# Patient Record
Sex: Male | Born: 1955 | Race: White | Hispanic: No | Marital: Married | Smoking: Never smoker
Health system: Southern US, Community
[De-identification: ages and names within clinical notes are randomized; demographics above are authoritative.]

## PROBLEM LIST (undated history)

## (undated) DIAGNOSIS — I10 Essential (primary) hypertension: Secondary | ICD-10-CM

## (undated) DIAGNOSIS — N289 Disorder of kidney and ureter, unspecified: Secondary | ICD-10-CM

## (undated) DIAGNOSIS — E119 Type 2 diabetes mellitus without complications: Secondary | ICD-10-CM

## (undated) HISTORY — PX: TONSILLECTOMY: SUR1361

## (undated) HISTORY — PX: KNEE SURGERY: SHX244

## (undated) HISTORY — PX: APPENDECTOMY: SHX54

---

## 2005-11-17 ENCOUNTER — Inpatient Hospital Stay (HOSPITAL_COMMUNITY): Admission: EM | Admit: 2005-11-17 | Discharge: 2005-11-18 | Payer: Self-pay | Admitting: Emergency Medicine

## 2006-02-01 ENCOUNTER — Other Ambulatory Visit: Payer: Self-pay

## 2006-02-07 ENCOUNTER — Ambulatory Visit: Payer: Self-pay | Admitting: Orthopedic Surgery

## 2014-03-29 ENCOUNTER — Encounter (HOSPITAL_COMMUNITY): Payer: Self-pay | Admitting: Emergency Medicine

## 2014-03-29 ENCOUNTER — Emergency Department (HOSPITAL_COMMUNITY)
Admission: EM | Admit: 2014-03-29 | Discharge: 2014-03-29 | Disposition: A | Discharge status: Home / Self Care | Payer: BC Managed Care – PPO | Attending: Emergency Medicine | Admitting: Emergency Medicine

## 2014-03-29 ENCOUNTER — Emergency Department (HOSPITAL_COMMUNITY): Payer: BC Managed Care – PPO

## 2014-03-29 DIAGNOSIS — E119 Type 2 diabetes mellitus without complications: Secondary | ICD-10-CM | POA: Diagnosis not present

## 2014-03-29 DIAGNOSIS — M47814 Spondylosis without myelopathy or radiculopathy, thoracic region: Secondary | ICD-10-CM

## 2014-03-29 DIAGNOSIS — Z79899 Other long term (current) drug therapy: Secondary | ICD-10-CM | POA: Diagnosis not present

## 2014-03-29 DIAGNOSIS — I10 Essential (primary) hypertension: Secondary | ICD-10-CM | POA: Insufficient documentation

## 2014-03-29 DIAGNOSIS — M546 Pain in thoracic spine: Secondary | ICD-10-CM

## 2014-03-29 DIAGNOSIS — Z87448 Personal history of other diseases of urinary system: Secondary | ICD-10-CM | POA: Diagnosis not present

## 2014-03-29 DIAGNOSIS — R52 Pain, unspecified: Secondary | ICD-10-CM

## 2014-03-29 DIAGNOSIS — M5134 Other intervertebral disc degeneration, thoracic region: Secondary | ICD-10-CM | POA: Diagnosis not present

## 2014-03-29 DIAGNOSIS — M545 Low back pain: Secondary | ICD-10-CM | POA: Diagnosis present

## 2014-03-29 DIAGNOSIS — Z7982 Long term (current) use of aspirin: Secondary | ICD-10-CM | POA: Insufficient documentation

## 2014-03-29 DIAGNOSIS — Z791 Long term (current) use of non-steroidal anti-inflammatories (NSAID): Secondary | ICD-10-CM | POA: Insufficient documentation

## 2014-03-29 HISTORY — DX: Disorder of kidney and ureter, unspecified: N28.9

## 2014-03-29 HISTORY — DX: Type 2 diabetes mellitus without complications: E11.9

## 2014-03-29 HISTORY — DX: Essential (primary) hypertension: I10

## 2014-03-29 MED ORDER — ONDANSETRON HCL 8 MG PO TABS: 8.0000 mg | ORAL_TABLET | Freq: Three times a day (TID) | ORAL | Status: DC | PRN

## 2014-03-29 MED ORDER — DIAZEPAM 10 MG PO TABS: 10.0000 mg | ORAL_TABLET | Freq: Four times a day (QID) | ORAL | Status: AC | PRN

## 2014-03-29 NOTE — Discharge Instructions (Signed)
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 Treatments include rest, heat and anti-inflammatory medications. We are prescribing some muscle relaxers to use to help you relax and improve your discomfort. Do not work or drive when taking the muscle relaxers. Use the resource guide provided, to find a primary care doctor to arrange for further care and treatment.  Back Pain, Adult Back pain is very common. The pain often gets better over time. The cause of back pain is usually not dangerous. Most people can learn to manage their back pain on their own.  HOME CARE   Stay active. Start with short walks on flat ground if you can. Try to walk farther each day.  Do not sit, drive, or stand in one place for more than 30 minutes. Do not stay in bed.  Do not avoid exercise or work. Activity can help your back heal faster.  Be careful when you bend or lift an object. Bend at your knees, keep the object close to you, and do not twist.  Sleep on a firm mattress. Lie on your side, and bend your knees. If you lie on your back, put a pillow under your knees.  Only take medicines as told by your doctor.  Put ice on the injured area.  Put ice in a plastic bag.  Place a towel between your skin and the bag.  Leave the ice on for 15-20 minutes, 03-04 times a day for the first 2 to 3 days. After that, you can switch between ice and heat packs.  Ask your doctor about back exercises or massage.  Avoid feeling anxious or stressed. Find good ways to deal with stress, such as exercise. GET HELP RIGHT AWAY IF:   Your pain does not go away with rest or medicine.  Your pain does not go away in 1 week.  You have new problems.  You do not feel well.  The pain spreads into your legs.  You cannot control when you poop (bowel movement) or pee (urinate).  Your arms or legs feel weak or lose feeling (numbness).  You feel sick to your stomach (nauseous) or throw up  (vomit).  You have belly (abdominal) pain.  You feel like you may pass out (faint). MAKE SURE YOU:   Understand these instructions.  Will watch your condition.  Will get help right away if you are not doing well or get worse. Document Released: 08/08/2007 Document Revised: 05/14/2011 Document Reviewed: 06/23/2013 [facility] Patient Information 2015 [place], [place]. This information is not intended to replace advice given to you by your health care provider. Make sure you discuss any questions you have with your health care provider.  Degenerative Disk Disease Degenerative disk disease is a condition caused by the changes that occur in the cushions of the backbone (spinal disks) as you grow older. Spinal disks are soft and compressible disks located between the bones of the spine (vertebrae). They act like shock absorbers. Degenerative disk disease can affect the whole spine. However, the neck and lower back are most commonly affected. Many changes can occur in the spinal disks with aging, such as:  The spinal disks may dry and shrink.  Small tears may occur in the tough, outer covering of the disk (annulus).  The disk space may become smaller due to loss of water.  Abnormal growths in the bone (spurs) may occur. This can put pressure on the nerve roots exiting the spinal canal, causing pain.  The  spinal canal may become narrowed. CAUSES  Degenerative disk disease is a condition caused by the changes that occur in the spinal disks with aging. The exact cause is not known, but there is a genetic basis for many patients. Degenerative changes can occur due to loss of fluid in the disk. This makes the disk thinner and reduces the space between the backbones. Small cracks can develop in the outer layer of the disk. This can lead to the breakdown of the disk. You are more likely to get degenerative disk disease if you are overweight. Smoking cigarettes and doing heavy work such as weightlifting  can also increase your risk of this condition. Degenerative changes can start after a sudden injury. Growth of bone spurs can compress the nerve roots and cause pain.  SYMPTOMS  The symptoms vary from person to person. Some people may have no pain, while others have severe pain. The pain may be so severe that it can limit your activities. The location of the pain depends on the part of your backbone that is affected. You will have neck or arm pain if a disk in the neck area is affected. You will have pain in your back, buttocks, or legs if a disk in the lower back is affected. The pain becomes worse while bending, reaching up, or with twisting movements. The pain may start gradually and then get worse as time passes. It may also start after a major or minor injury. You may feel numbness or tingling in the arms or legs.  DIAGNOSIS  Your caregiver will ask you about your symptoms and about activities or habits that may cause the pain. He or she may also ask about any injuries, diseases, or treatments you have had earlier. Your caregiver will examine you to check for the range of movement that is possible in the affected area, to check for strength in your extremities, and to check for sensation in the areas of the arms and legs supplied by different nerve roots. An X-ray of the spine may be taken. Your caregiver may suggest other imaging tests, such as magnetic resonance imaging (MRI), if needed.  TREATMENT  Treatment includes rest, modifying your activities, and applying ice and heat. Your caregiver may prescribe medicines to reduce your pain and may ask you to do some exercises to strengthen your back. In some cases, you may need surgery. You and your caregiver will decide on the treatment that is best for you. HOME CARE INSTRUCTIONS   Follow proper lifting and walking techniques as advised by your caregiver.  Maintain good posture.  Exercise regularly as advised.  Perform relaxation  exercises.  Change your sitting, standing, and sleeping habits as advised. Change positions frequently.  Lose weight as advised.  Stop smoking if you smoke.  Wear supportive footwear. SEEK MEDICAL CARE IF:  Your pain does not go away within 1 to 4 weeks. SEEK IMMEDIATE MEDICAL CARE IF:   Your pain is severe.  You notice weakness in your arms, hands, or legs.  You begin to lose control of your bladder or bowel movements. MAKE SURE YOU:   Understand these instructions.  Will watch your condition.  Will get help right away if you are not doing well or get worse. Document Released: 12/17/2006 Document Revised: 05/14/2011 Document Reviewed: 06/23/2013 [facility] Patient Information 2015 [place], [place]. This information is not intended to replace advice given to you by your health care provider. Make sure you discuss any questions you have with your health  care provider.  Emergency Department Resource Guide 1) Find a Doctor and Pay Out of Pocket Although you won't have to find out who is covered by your insurance plan, it is a good idea to ask around and get recommendations. You will then need to call the office and see if the doctor you have chosen will accept you as a new patient and what types of options they offer for patients who are self-pay. Some doctors offer discounts or will set up payment plans for their patients who do not have insurance, but you will need to ask so you aren't surprised when you get to your appointment.  2) Contact Your Local Health Department Not all health departments have doctors that can see patients for sick visits, but many do, so it is worth a call to see if yours does. If you don't know where your local health department is, you can check in your phone book. The CDC also has a tool to help you locate your state's health department, and many state websites also have listings of all of their local health departments.  3) Find a Walk-in Clinic If your  illness is not likely to be very severe or complicated, you may want to try a walk in clinic. These are popping up all over the country in pharmacies, drugstores, and shopping centers. They're usually staffed by nurse practitioners or physician assistants that have been trained to treat common illnesses and complaints. They're usually fairly quick and inexpensive. However, if you have serious medical issues or chronic medical problems, these are probably not your best option.  No Primary Care Doctor: - Call Health Connect at  [phone] - they can help you locate a primary care doctor that  accepts your insurance, provides certain services, etc. - Physician Referral Service- [phone]  Chronic Pain Problems: Organization         Address  Phone   Notes  [facility]  [phone] Patients need to be referred by their primary care doctor.   Medication Assistance: Organization         Address  Phone   Notes  [facility] Medication [facility] [address] [place] [place], [place] [address] [phone] --Must be a resident of [facility] -- Must have NO insurance coverage whatsoever (no Medicaid/ Medicare, etc.) -- The pt. MUST have a primary care doctor that directs their care regularly and follows them in the community   MedAssist  [phone]   [employer]  [phone]    Agencies that provide inexpensive medical care: Organization         Address  Phone   Notes  [facility]  [phone]   [facility]    [phone]   [facility] [address] [place], [place] [address] [phone]   [facility] [address] [place], [place] [phone]   Planned Parenthood    [phone]   [facility]    [phone]   Community Health and [facility]  [address], [place] Phone:  [phone], Fax:  [phone] Hours of Operation:   9 am - 6 pm, M-F.  Also accepts Medicaid/Medicare and self-pay.  [facility]  [address], [place] Phone: [phone], Fax: [phone]. Hours of Operation:  8:30 am - 5:30 pm, M-F.  Also accepts Medicaid and self-pay.  [facility] [place] [address], [place] Phone: [phone](336) [phone]   [facility] [address], [place], [place] [phone], Ext. [phone] Mondays & Thursdays: 7-9 AM.  First 15 patients are seen on a first come, first serve basis.    Medicaid-accepting [facility] Providers:  Organization         Address  Phone   Notes  [facility] [address], [place] [phone]) [phone] Also accepts self-pay patients.  [facility] [address] [place], [place]  [phone](336) [phone]   [facility] [address], [place] [phone]) [phone]   [facility] [address] [place] [address], [place] [phone]) [phone]   Renaye RakersVeita Bland [address], [place]   [phone]) [phone] Only accepts [place] Access [place] patients after they have their name applied to their card.   Self-Pay (no insurance) in [facility]:  Organization         Address  Phone   Notes  Sickle Cell Patients, [facility] [address], [place] [phone]) [phone]   [facility] [address], [place] [phone]) [phone]   [facility] [place]  [address], [place] [phone]) [phone]   [facility]/Dr. Osei-Bonsu  [address] [place] [address], [place] or [phone], [place] [phone] Phone number for both [place] and [place] locations is the same.  [facility] [address], [place] [phone]) [phone]   [facility] [address] [place] [address], [place] or [address] [phone](336) [phone]   [facility] [address], [place] [phone]) [phone], phone; [phone](336) [phone], fax Sees patients 1st and 3rd Saturday of every month.  Must not qualify for public or private insurance (i.e. Medicaid, Medicare, [place] Health Choice, Veterans' Benefits)  Household income should be no more than 200% of the poverty level The clinic cannot treat you if you are pregnant or think you are pregnant  Sexually transmitted diseases are not treated at the clinic.    Dental Care: Organization         Address  Phone  Notes  [facility] of [facility] [address], [place] [phone] Accepts children up to age 61 who are enrolled in [place] or [place] Health Choice; pregnant women with a Medicaid card; and children who have applied for Medicaid or [place] Health Choice, but were declined, whose parents can pay a reduced fee at time of service.  [facility] Department of [facility] [place]  [address], [place] [phone]) [phone] Accepts children up to age 61 who are enrolled in [place] or [place] Health Choice; pregnant women with a Medicaid card; and children who have applied for Medicaid or [place] Health Choice, but were declined, whose parents can pay a reduced fee at time of service.  [facility]  [address], [place] [phone]) [phone] Patients are seen by appointment only. Walk-ins are not accepted. [facility] will see patients 61 years of age and older. Monday - Tuesday (8am-5pm) Most Wednesdays (8:30-5pm) $30 per visit, cash only  [facility]  [address], [facility] [phone] Patients are seen by appointment only. Walk-ins are not accepted. [facility] will see patients 61 years of age  and older. One Wednesday Evening (Monthly: Volunteer Based).  $30 per visit, cash only  [employer]  [phone] for adults; Children under age 32, call Graduate Pediatric Dentistry at  [phone]. Children aged 56-14, please call [phone] to request a pediatric application.  Dental services are provided in all areas of dental care including fillings, crowns and bridges, complete and partial dentures, implants, gum treatment, root canals, and extractions. Preventive care is also provided. Treatment is provided to both adults and children. Patients are selected via a lottery and there is often a waiting list.   [facility] [address] [place]  [phone] [email or web address]   [facility] [address] [place], [place] [phone], Ext. [phone] Second and Fourth Thursday of each month, opens at 6:30 AM; Clinic ends at 9 AM.  Patients are seen on a first-come first-served basis, and a limited number are seen during each clinic.   [facility]  [address] [place], [place] [phone]   Eligibility Requirements You must have lived in [place], [place], or [place] counties for at least the last three months.   You cannot be eligible for state or federal sponsored [place], including [employer], [place], or [employer].   You generally cannot be eligible for healthcare insurance through your employer.    How to apply: Eligibility screenings are held every Tuesday and Wednesday afternoon from 1:00 pm until 4:00 pm. You do not need an appointment for the interview!  [facility] [address], [place], [place] [phone]   [facility] Health Department  [phone]   [facility] Health Department  [phone]   [facility] Health Department  [phone]    Behavioral Health Resources in the Community: Intensive Outpatient Programs Organization         Address  Phone  Notes  [facility] [address] [place], [place] [phone]   [facility] Outpatient [address] [place], [place] [phone]   [facility]: Alcohol &  Drug [facility]cs [address] [place], [place]  [phone]   [facility] Mental Health [address]  [place], [place] [phone] or [phone]   Substance Abuse Resources Organization         Address  Phone  Notes  [facility] Drug Services  [phone]   [facility]  [phone]   [facility] [place]  [phone]   Floydene Flock  [phone]   Residential & Outpatient Substance Abuse Program  [phone]   Psychological Services Organization         Address  Phone  Notes  [facility] Behavioral Health  [phone]   [facility]  [phone]   [facility] Mental Health [address] [place] [phone] or [phone]    Mobile Crisis Teams Organization         Address  Phone  Notes  [facility], Mobile Crisis Care Unit  [phone]   [facility]  [address] [place], [place] [phone]   Doristine Locks [address] [place] [phone]    Self-Help/Support Groups Organization         Address  Phone             Notes  [facility] of [place] - variety of support groups  336- I7437963 Call for more information  Narcotics Anonymous (NA), [facility] [address], [employer] [place]  2 meetings  at this location   Residential Treatment Programs Organization         Address  Phone  Notes  [facility] [address]    [place]  [phone]   [facility]  [address], [place] [address], [place], [place] [phone]   [facility] [address] [place], [place] [phone] Admissions: 8am-3pm M-F  [facility] [address]    [place], [place] [phone]   [facility] [address] [place], [place], [place] [phone]   [facility] [address]  [place], [place] [phone]   [facility] - Intensive Outpatient [address], [place], [place]  [phone]   [facility] ([facility] [address]  [place], [place] [phone] or [phone]   [facility] ([facility]) [address] [place], [place] [phone] Accepts Medicaid  [facility] [place] [address],  [place] [phone] Substance Abuse/Addiction Treatment   [facility] Organization         Address  Phone  Notes  [facility]  [phone]   Angie Fava, PhD [address] [place], [place]   [phone] or [phone]   [facility] Behavioral   [address] [place], [place] [phone]   [facility] [address], [place], [place] [phone] Insurance/Medicaid/sponsorship through [facility] and Families [address]                                    [place], [place] [phone] Therapy/tele-psych/case  [facility] [address], [place] [phone]    Dr. Lolly Mustache  [phone]   [facility]  [employer] [facility] Dept. 1) [address] [place] 2) [address], [place] 3)  [address], [place] [phone]  [phone]   [facility] Child Abuse Hotline (726)683-4296 or [phone] (After Hours)

## 2014-03-29 NOTE — ED Notes (Signed)
PT c/o lower back pain x4 weeks and was started on Meloxicam by his primary doctor on 03/23/14. PT c/o worsening in lower back pain at times in the past few days. PT denies any urinary symptoms or injury to his back.

## 2014-03-29 NOTE — ED Notes (Signed)
MD at bedside. 

## 2014-03-29 NOTE — ED Provider Notes (Signed)
CSN: [id number]     Arrival date & time 03/29/14  [id number] History  This chart was scribed for Flint MelterElliott L Raihana Balderrama, MD by Tonye RoyaltyJoshua Chen, ED Scribe. This patient was seen in room APA11/[facility]11 and the patient's care was started at 9:32 AM.    Chief Complaint  Patient presents with  . Back Pain   The history is provided by the patient. No language interpreter was used.    HPI Comments: Douglas Hoover is a 59 y.o. male with history of HTN and DM who presents to the Emergency Department complaining of low back pain and right lower thoracic pain with onset 4 weeks ago. He states he has not had similar pain previously. He was started on Meloxicam on 1/19 but states he has not had any improvement. He states pain does improve after using heating pad. He denies any injury. He reports increased pain with walking or standing for extended periods of time. He states pain goes down to his thighs and extends further after standing for an extended time. 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 He also notes a different back pain with onset 2-3 months ago after moving some wood. He reports prior surgeries to his appendix, knee, and finger. He denies ever smoking. He denies SOB or coldness in his feet  Past Medical History  Diagnosis Date  . Hypertension   . Diabetes mellitus without complication   . Renal disorder    Past Surgical History  Procedure Laterality Date  . Appendectomy    . Knee surgery    . Tonsillectomy     Family History  Problem Relation Age of Onset  . Heart failure Mother   . Hypertension Mother    History  Substance Use Topics  . Smoking status: Never Smoker   . Smokeless tobacco: Not on file  . Alcohol Use: No    Review of Systems  Respiratory: Negative for shortness of breath.   Musculoskeletal: Positive for back pain.  All other systems reviewed and are negative.     Allergies  Peppermint oil  Home Medications    Prior to Admission medications   Medication Sig Start Date End Date Taking? Authorizing Provider  acetaminophen (TYLENOL) 650 MG CR tablet Take 1,300 mg by mouth every 8 (eight) hours as needed for pain.   Yes Historical Provider, MD  aspirin EC 81 MG tablet Take 81 mg by mouth daily.   Yes Historical Provider, MD  diazepam (VALIUM) 10 MG tablet Take 1 tablet (10 mg total) by mouth every 6 (six) hours as needed (Muscle spasm). 03/29/14   Flint MelterElliott L Donnalynn Wheeless, MD  lisinopril (PRINIVIL,ZESTRIL) 20 MG tablet Take 20 mg by mouth daily.   Yes Historical Provider, MD  meloxicam (MOBIC) 7.5 MG tablet Take 7.5 mg by mouth daily.   Yes Historical Provider, MD  metFORMIN (GLUCOPHAGE) 1000 MG tablet Take 1,000 mg by mouth 2 (two) times daily with a meal.   Yes Historical Provider, MD   BP 157/89 mmHg  Pulse 89  Temp(Src) 97.9 F (36.6 C) (Oral)  Resp 16  Ht 5\' 7"  (1.702 m)  Wt 242 lb (109.77 kg)  BMI 37.89 kg/m2  SpO2 98% Physical Exam  Constitutional: He is oriented to person, place, and time. He appears well-developed and well-nourished.  HENT:  Head: Normocephalic and atraumatic.  Right Ear: External ear normal.  Left Ear: External ear normal.  Eyes: Conjunctivae and EOM are normal.  Pupils are equal, round, and reactive to light.  Neck: Normal range of motion and phonation normal. Neck supple.  Cardiovascular: Normal rate, regular rhythm and normal heart sounds.   Pulmonary/Chest: Effort normal and breath sounds normal. He exhibits tenderness (Left chest wall tend without deformity or crepitation). He exhibits no bony tenderness.  Abdominal: Soft. There is no tenderness.  Musculoskeletal: Normal range of motion.  On deep breath has right lower thoracic pain Right paravertebral lumbar tenderness  Neurological: He is alert and oriented to person, place, and time. No cranial nerve deficit or sensory deficit. He exhibits normal muscle tone. Coordination normal.  Skin: Skin is warm, dry and intact.   Psychiatric: He has a normal mood and affect. His behavior is normal. Judgment and thought content normal.  Nursing note and vitals reviewed.   ED Course  Procedures (including critical care time)  DIAGNOSTIC STUDIES: Oxygen Saturation is 96% on room air, adequate by my interpretation.    COORDINATION OF CARE: 9:40 AM Discussed treatment plan with patient at beside, the patient agrees with the plan and has no further questions at this time.  10:29 AM Discussed with patient his imaging results which reveal evidence of arthritis. Patient agrees with plan for discharge with prescription for muscle relaxants.  Labs Review Labs Reviewed - No data to display  Imaging Review Dg Ribs Unilateral W/chest Right  03/29/2014   CLINICAL DATA:  Pain  EXAM: RIGHT RIBS AND CHEST - 3+ VIEW  COMPARISON:  None.  FINDINGS: No fracture or other bone lesions are seen involving the ribs. There is no evidence of pneumothorax or pleural effusion. Both lungs are clear. Heart size and mediastinal contours are within normal limits.  IMPRESSION: Negative.   Electronically Signed   By: Elige Ko   On: 03/29/2014 10:16   Dg Lumbar Spine Complete  03/29/2014   CLINICAL DATA:  Low back pain for 1 month, no known injury, initial encounter  EXAM: LUMBAR SPINE - COMPLETE 4+ VIEW  COMPARISON:  None.  FINDINGS: Five lumbar type vertebral bodies are well visualized. Vertebral body height is well maintained. No spondylolysis or spondylolisthesis is noted. Mild facet hypertrophic changes are noted.  IMPRESSION: Degenerative change without acute abnormality.   Electronically Signed   By: Alcide Clever M.D.   On: 03/29/2014 10:22     EKG Interpretation None      MDM   Final diagnoses:  Pain  Right-sided thoracic back pain  Degenerative joint disease of thoracic spine      Back pain related to degenerative joint disease of the thoracic spine.  There is likely mild radicular component.  Doubt spinal stenosis or spinal  myelopathy.  Nursing Notes Reviewed/ Care Coordinated Applicable Imaging Reviewed Interpretation of Laboratory Data incorporated into ED treatment  The patient appears reasonably screened and/or stabilized for discharge and I doubt any other medical condition or other [facility] requiring further screening, evaluation, or treatment in the ED at this time prior to discharge.  Plan: Home Medications- Valium, continue Meloxicam; Home Treatments- heat; return here if the recommended treatment, does not improve the symptoms; Recommended follow up- PCP 1 week   I personally performed the services described in this documentation, which was scribed in my presence. The recorded information has been reviewed and is accurate.     Flint Melter, MD 03/29/14 [phone]

## 2016-02-04 IMAGING — DX DG LUMBAR SPINE COMPLETE 4+V
5 series · 5 of 5 positions shown · non-contrast
Comparison: None.

CLINICAL DATA: Low back pain for 1 month, no known injury, initial
encounter

EXAM:
LUMBAR SPINE - COMPLETE 4+ VIEW

[l-spine ap]
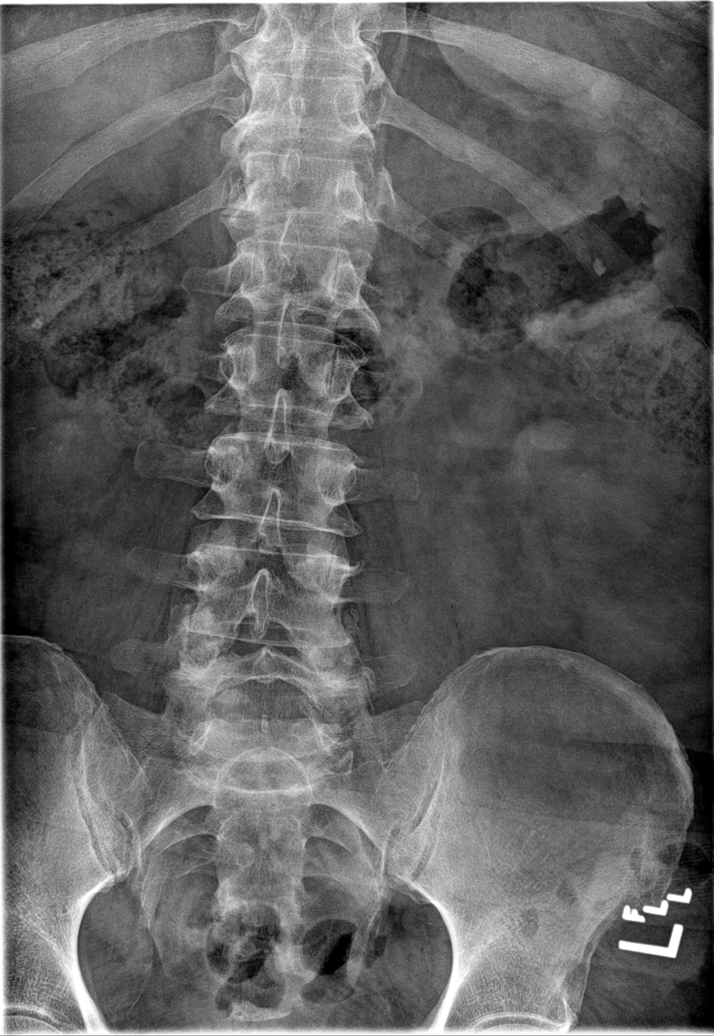

[l-spine obl (1 of 2)]
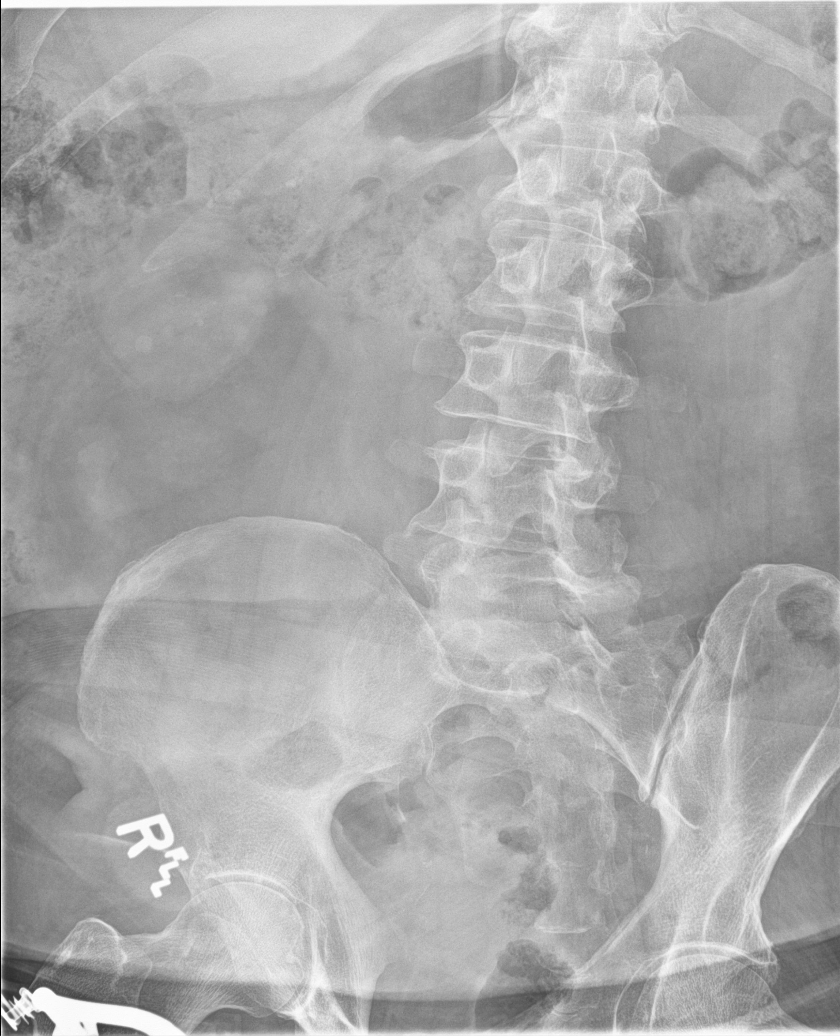

[l-spine obl (2 of 2)]
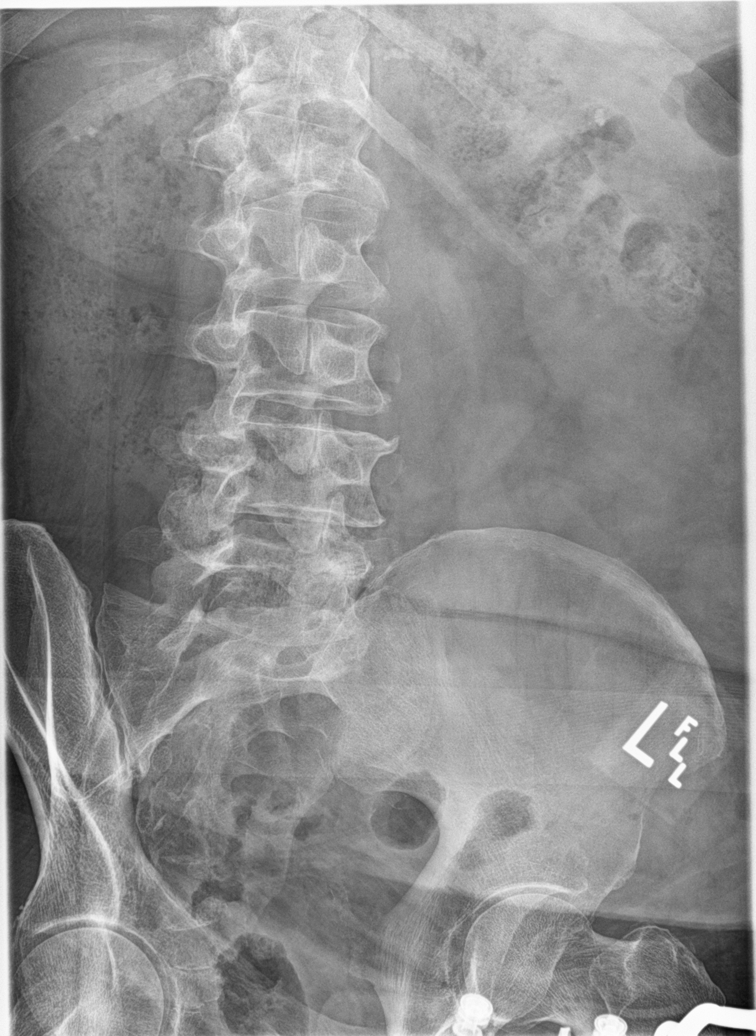

[l-spine lat]
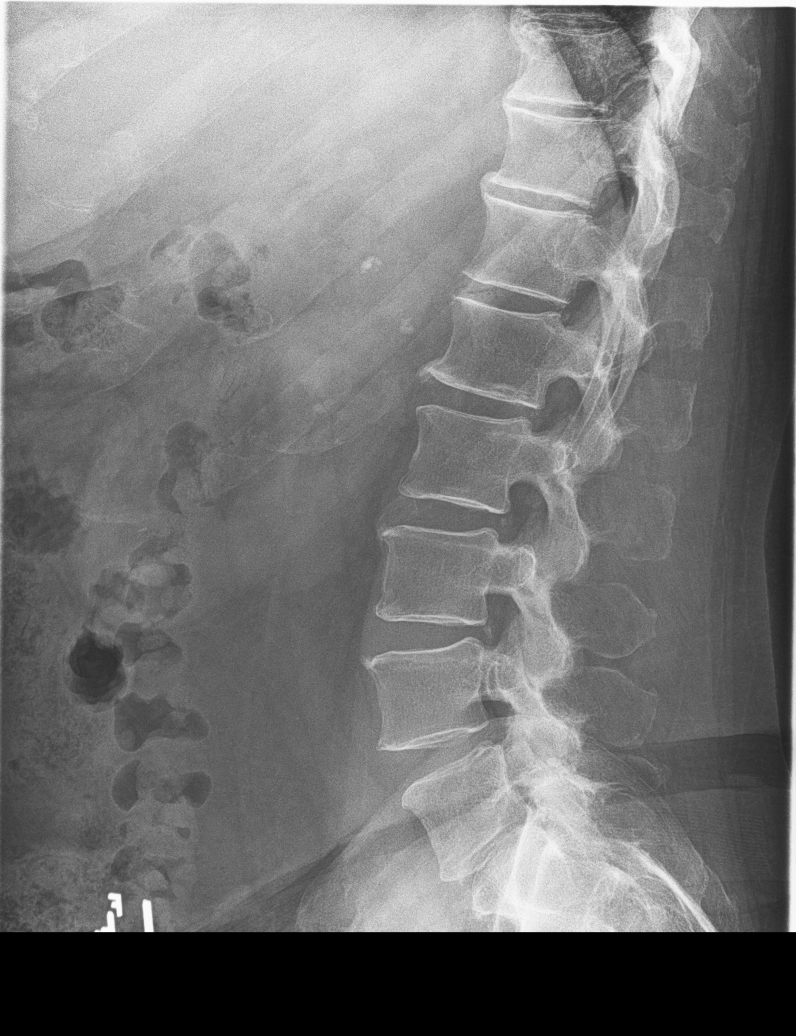

[l-spine spot]
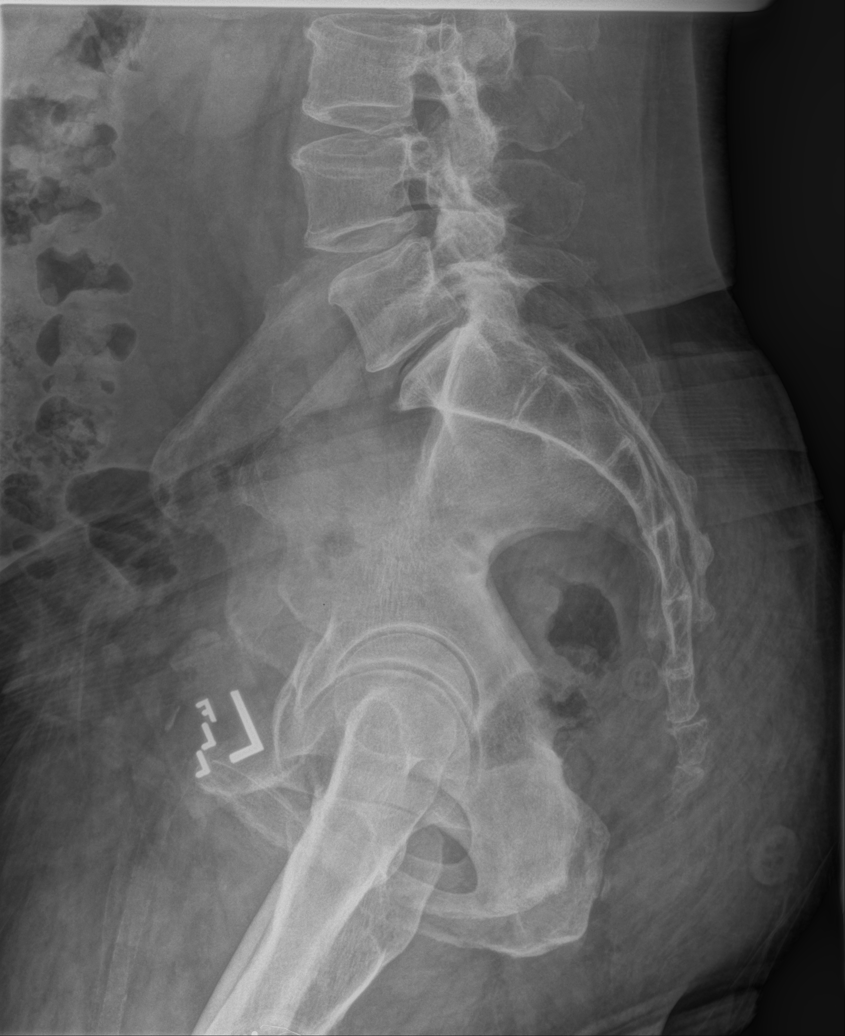

[5 of 5 positions shown; findings below may reference images not displayed]

FINDINGS: Five lumbar type vertebral bodies are well visualized. Vertebral
body height is well maintained. No spondylolysis or
spondylolisthesis is noted. Mild facet hypertrophic changes are
noted.
IMPRESSION: Degenerative change without acute abnormality.

## 2020-12-29 ENCOUNTER — Telehealth: Payer: Self-pay | Admitting: Orthopedic Surgery

## 2020-12-29 NOTE — Telephone Encounter (Signed)
Patient went to [facility] ER and wants to be seen at our office for his finger,  I advised him we can't see information from [facility] and he will need to get the records from the ER and any xray on a CD before we can schedule an appt to see him.    He asked why did they tell him to call a doctor as soon as possible if no better.  I asked him did it list a doctor in [place] for him to follow up with.  He said he is not going to go to any doctor in [place].    He will call us back after he gets the notes and CD

## 2022-05-03 ENCOUNTER — Encounter: Payer: Self-pay | Admitting: Radiology
